# Patient Record
Sex: Male | Born: 1990 | Race: Black or African American | Hispanic: No | Marital: Single | Smoking: Never smoker
Health system: Southern US, Community
[De-identification: ages and names within clinical notes are randomized; demographics above are authoritative.]

## PROBLEM LIST (undated history)

## (undated) HISTORY — PX: ARTHROSCOPIC REPAIR ACL: SUR80

## (undated) HISTORY — PX: HAND SURGERY: SHX662

---

## 2011-08-01 ENCOUNTER — Emergency Department (HOSPITAL_COMMUNITY)
Admission: EM | Admit: 2011-08-01 | Discharge: 2011-08-01 | Disposition: A | Discharge status: Home / Self Care | Payer: BC Managed Care – PPO | Attending: Emergency Medicine | Admitting: Emergency Medicine

## 2011-08-01 ENCOUNTER — Emergency Department (HOSPITAL_COMMUNITY): Payer: BC Managed Care – PPO

## 2011-08-01 DIAGNOSIS — W219XXA Striking against or struck by unspecified sports equipment, initial encounter: Secondary | ICD-10-CM | POA: Insufficient documentation

## 2011-08-01 DIAGNOSIS — M7989 Other specified soft tissue disorders: Secondary | ICD-10-CM | POA: Insufficient documentation

## 2011-08-01 DIAGNOSIS — S62339A Displaced fracture of neck of unspecified metacarpal bone, initial encounter for closed fracture: Secondary | ICD-10-CM | POA: Insufficient documentation

## 2011-08-01 DIAGNOSIS — Y9361 Activity, american tackle football: Secondary | ICD-10-CM | POA: Insufficient documentation

## 2011-08-01 DIAGNOSIS — M79609 Pain in unspecified limb: Secondary | ICD-10-CM | POA: Insufficient documentation

## 2011-08-05 ENCOUNTER — Ambulatory Visit (HOSPITAL_BASED_OUTPATIENT_CLINIC_OR_DEPARTMENT_OTHER)
Admission: RE | Admit: 2011-08-05 | Discharge: 2011-08-05 | Disposition: A | Discharge status: Home / Self Care | Payer: BC Managed Care – PPO | Source: Ambulatory Visit | Attending: Orthopedic Surgery | Admitting: Orthopedic Surgery

## 2011-08-05 DIAGNOSIS — Y9361 Activity, american tackle football: Secondary | ICD-10-CM | POA: Insufficient documentation

## 2011-08-05 DIAGNOSIS — Y929 Unspecified place or not applicable: Secondary | ICD-10-CM | POA: Insufficient documentation

## 2011-08-05 DIAGNOSIS — W208XXA Other cause of strike by thrown, projected or falling object, initial encounter: Secondary | ICD-10-CM | POA: Insufficient documentation

## 2011-08-05 DIAGNOSIS — S62339A Displaced fracture of neck of unspecified metacarpal bone, initial encounter for closed fracture: Secondary | ICD-10-CM | POA: Insufficient documentation

## 2011-08-05 DIAGNOSIS — Z01812 Encounter for preprocedural laboratory examination: Secondary | ICD-10-CM | POA: Insufficient documentation
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
## 2014-09-26 ENCOUNTER — Emergency Department: Payer: Self-pay | Admitting: Emergency Medicine

## 2014-09-30 ENCOUNTER — Emergency Department (HOSPITAL_COMMUNITY)
Admission: EM | Admit: 2014-09-30 | Discharge: 2014-09-30 | Disposition: A | Discharge status: Home / Self Care | Payer: Worker's Compensation | Attending: Emergency Medicine | Admitting: Emergency Medicine

## 2014-09-30 ENCOUNTER — Encounter (HOSPITAL_COMMUNITY): Payer: Self-pay | Admitting: Emergency Medicine

## 2014-09-30 DIAGNOSIS — M549 Dorsalgia, unspecified: Secondary | ICD-10-CM | POA: Diagnosis present

## 2014-09-30 DIAGNOSIS — M545 Low back pain, unspecified: Secondary | ICD-10-CM

## 2014-09-30 DIAGNOSIS — Z4801 Encounter for change or removal of surgical wound dressing: Secondary | ICD-10-CM | POA: Diagnosis not present

## 2014-09-30 DIAGNOSIS — IMO0002 Reserved for concepts with insufficient information to code with codable children: Secondary | ICD-10-CM

## 2014-09-30 MED ORDER — CEPHALEXIN 500 MG PO CAPS: 500.0000 mg | ORAL_CAPSULE | Freq: Four times a day (QID) | ORAL | Status: AC
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
## 2014-09-30 NOTE — Discharge Instructions (Signed)
Cryotherapy °Cryotherapy means treatment with cold. Ice or gel packs can be used to reduce both pain and swelling. Ice is the most helpful within the first 24 to 48 hours after an injury or flare-up from overusing a muscle or joint. Sprains, strains, spasms, burning pain, shooting pain, and aches can all be eased with ice. Ice can also be used when recovering from surgery. Ice is effective, has very few side effects, and is safe for most people to use. °PRECAUTIONS  °Ice is not a safe treatment option for people with: °· Raynaud phenomenon. This is a condition affecting small blood vessels in the extremities. Exposure to cold may cause your problems to return. °· Cold hypersensitivity. There are many forms of cold hypersensitivity, including: °¨ Cold urticaria. Red, itchy hives appear on the skin when the tissues begin to warm after being iced. °¨ Cold erythema. This is a red, itchy rash caused by exposure to cold. °¨ Cold hemoglobinuria. Red blood cells break down when the tissues begin to warm after being iced. The hemoglobin that carry oxygen are passed into the urine because they cannot combine with blood proteins fast enough. °· Numbness or altered sensitivity in the area being iced. °If you have any of the following conditions, do not use ice until you have discussed cryotherapy with your caregiver: °· Heart conditions, such as arrhythmia, angina, or chronic heart disease. °· High blood pressure. °· Healing wounds or open skin in the area being iced. °· Current infections. °· Rheumatoid arthritis. °· Poor circulation. °· Diabetes. °Ice slows the blood flow in the region it is applied. This is beneficial when trying to stop inflamed tissues from spreading irritating chemicals to surrounding tissues. However, if you expose your skin to cold temperatures for too long or without the proper protection, you can damage your skin or nerves. Watch for signs of skin damage due to cold. °HOME CARE INSTRUCTIONS °Follow  these tips to use ice and cold packs safely. °· Place a dry or damp towel between the ice and skin. A damp towel will cool the skin more quickly, so you may need to shorten the time that the ice is used. °· For a more rapid response, add gentle compression to the ice. °· Ice for no more than 10 to 20 minutes at a time. The bonier the area you are icing, the less time it will take to get the benefits of ice. °· Check your skin after 5 minutes to make sure there are no signs of a poor response to cold or skin damage. °· Rest 20 minutes or more between uses. °· Once your skin is numb, you can end your treatment. You can test numbness by very lightly touching your skin. The touch should be so light that you do not see the skin dimple from the pressure of your fingertip. When using ice, most people will feel these normal sensations in this order: cold, burning, aching, and numbness. °· Do not use ice on someone who cannot communicate their responses to pain, such as small children or people with dementia. °HOW TO MAKE AN ICE PACK °Ice packs are the most common way to use ice therapy. Other methods include ice massage, ice baths, and cryosprays. Muscle creams that cause a cold, tingly feeling do not offer the same benefits that ice offers and should not be used as a substitute unless recommended by your caregiver. °To make an ice pack, do one of the following: °· Place crushed ice or a   bag of frozen vegetables in a sealable plastic bag. Squeeze out the excess air. Place this bag inside another plastic bag. Slide the bag into a pillowcase or place a damp towel between your skin and the bag. °· Mix 3 parts water with 1 part rubbing alcohol. Freeze the mixture in a sealable plastic bag. When you remove the mixture from the freezer, it will be slushy. Squeeze out the excess air. Place this bag inside another plastic bag. Slide the bag into a pillowcase or place a damp towel between your skin and the bag. °SEEK MEDICAL CARE  IF: °· You develop white spots on your skin. This may give the skin a blotchy (mottled) appearance. °· Your skin turns blue or pale. °· Your skin becomes waxy or hard. °· Your swelling gets worse. °MAKE SURE YOU:  °· Understand these instructions. °· Will watch your condition. °· Will get help right away if you are not doing well or get worse. °Document Released: 07/05/2011 Document Revised: 03/25/2014 Document Reviewed: 07/05/2011 °ExitCare® Patient Information ©2015 [employer]. This information is not intended to replace advice given to you by your health care provider. Make sure you discuss any questions you have with your health care provider. °Heat Therapy °Heat therapy can help ease sore, stiff, injured, and tight muscles and joints. Heat relaxes your muscles, which may help ease your pain.  °RISKS AND COMPLICATIONS °If you have any of the following conditions, do not use heat therapy unless your health care provider has approved: °· Poor circulation. °· Healing wounds or scarred skin in the area being treated. °· Diabetes, heart disease, or high blood pressure. °· Not being able to feel (numbness) the area being treated. °· Unusual swelling of the area being treated. °· Active infections. °· Blood clots. °· Cancer. °· Inability to communicate pain. This may include young children and people who have problems with their brain function (dementia). °· Pregnancy. °Heat therapy should only be used on old, pre-existing, or long-lasting (chronic) injuries. Do not use heat therapy on new injuries unless directed by your health care provider. °HOW TO USE HEAT THERAPY °There are several different kinds of heat therapy, including: °· Moist heat pack. °· Warm water bath. °· Hot water bottle. °· Electric heating pad. °· Heated gel pack. °· Heated wrap. °· Electric heating pad. °Use the heat therapy method suggested by your health care provider. Follow your health care provider's instructions on when and how to use heat  therapy. °GENERAL HEAT THERAPY RECOMMENDATIONS °· Do not sleep while using heat therapy. Only use heat therapy while you are awake. °· Your skin may turn pink while using heat therapy. Do not use heat therapy if your skin turns red. °· Do not use heat therapy if you have new pain. °· High heat or long exposure to heat can cause burns. Be careful when using heat therapy to avoid burning your skin. °· Do not use heat therapy on areas of your skin that are already irritated, such as with a rash or sunburn. °SEEK MEDICAL CARE IF: °· You have blisters, redness, swelling, or numbness. °· You have new pain. °· Your pain is worse. °MAKE SURE YOU: °· Understand these instructions. °· Will watch your condition. °· Will get help right away if you are not doing well or get worse. °Document Released: 01/31/2012 Document Revised: 03/25/2014 Document Reviewed: 01/01/2014 °ExitCare® Patient Information ©2015 [employer]. This information is not intended to replace advice given to you by your health care provider.   Make sure you discuss any questions you have with your health care provider. Muscle Strain A muscle strain is an injury that occurs when a muscle is stretched beyond its normal length. Usually a small number of muscle fibers are torn when this happens. Muscle strain is rated in degrees. First-degree strains have the least amount of muscle fiber tearing and pain. Second-degree and third-degree strains have increasingly more tearing and pain.  Usually, recovery from muscle strain takes 1-2 weeks. Complete healing takes 5-6 weeks.  CAUSES  Muscle strain happens when a sudden, violent force placed on a muscle stretches it too far. This may occur with lifting, sports, or a fall.  RISK FACTORS Muscle strain is especially common in athletes.  SIGNS AND SYMPTOMS At the site of the muscle strain, there may be:  Pain.  Bruising.  Swelling.  Difficulty using the muscle due to pain or lack of normal  function. DIAGNOSIS  Your health care provider will perform a physical exam and ask about your medical history. TREATMENT  Often, the best treatment for a muscle strain is resting, icing, and applying cold compresses to the injured area.  HOME CARE INSTRUCTIONS   Use the PRICE method of treatment to promote muscle healing during the first 2-3 days after your injury. The PRICE method involves:  Protecting the muscle from being injured again.  Restricting your activity and resting the injured body part.  Icing your injury. To do this, put ice in a plastic bag. Place a towel between your skin and the bag. Then, apply the ice and leave it on from 15-20 minutes each hour. After the third day, switch to moist heat packs.  Apply compression to the injured area with a splint or elastic bandage. Be careful not to wrap it too tightly. This may interfere with blood circulation or increase swelling.  Elevate the injured body part above the level of your heart as often as you can.  Only take over-the-counter or prescription medicines for pain, discomfort, or fever as directed by your health care provider.  Warming up prior to exercise helps to prevent future muscle strains. SEEK MEDICAL CARE IF:   You have increasing pain or swelling in the injured area.  You have numbness, tingling, or a significant loss of strength in the injured area. MAKE SURE YOU:   Understand these instructions.  Will watch your condition.  Will get help right away if you are not doing well or get worse. Document Released: 11/08/2005 Document Revised: 08/29/2013 Document Reviewed: 06/07/2013 [facility] Patient Information 2015 [place], [place]. This information is not intended to replace advice given to you by your health care provider. Make sure you discuss any questions you have with your health care provider.
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
